# Patient Record
Sex: Female | Born: 1975 | Race: White | Hispanic: No | Marital: Married | Smoking: Never smoker
Health system: Southern US, Community
[De-identification: ages and names within clinical notes are randomized; demographics above are authoritative.]

## PROBLEM LIST (undated history)

## (undated) DIAGNOSIS — C801 Malignant (primary) neoplasm, unspecified: Secondary | ICD-10-CM

## (undated) DIAGNOSIS — C439 Malignant melanoma of skin, unspecified: Secondary | ICD-10-CM

## (undated) HISTORY — PX: ECTOPIC PREGNANCY SURGERY: SHX613

## (undated) HISTORY — PX: BREAST SURGERY: SHX581

## (undated) HISTORY — PX: BREAST ENHANCEMENT SURGERY: SHX7

## (undated) HISTORY — DX: Malignant (primary) neoplasm, unspecified: C80.1

---

## 2001-12-26 ENCOUNTER — Other Ambulatory Visit: Admission: RE | Admit: 2001-12-26 | Discharge: 2001-12-26 | Payer: Self-pay | Admitting: Obstetrics and Gynecology

## 2002-05-10 ENCOUNTER — Ambulatory Visit (HOSPITAL_COMMUNITY): Admission: RE | Admit: 2002-05-10 | Discharge: 2002-05-10 | Payer: Self-pay | Admitting: Obstetrics and Gynecology

## 2002-05-10 ENCOUNTER — Encounter: Payer: Self-pay | Admitting: Obstetrics and Gynecology

## 2002-07-08 ENCOUNTER — Inpatient Hospital Stay (HOSPITAL_COMMUNITY): Admission: AD | Admit: 2002-07-08 | Discharge: 2002-07-08 | Payer: Self-pay | Admitting: Obstetrics and Gynecology

## 2003-02-28 ENCOUNTER — Other Ambulatory Visit: Admission: RE | Admit: 2003-02-28 | Discharge: 2003-02-28 | Payer: Self-pay | Admitting: Obstetrics and Gynecology

## 2003-09-02 ENCOUNTER — Inpatient Hospital Stay (HOSPITAL_COMMUNITY): Admission: AD | Admit: 2003-09-02 | Discharge: 2003-09-02 | Payer: Self-pay | Admitting: Obstetrics and Gynecology

## 2003-09-03 ENCOUNTER — Inpatient Hospital Stay (HOSPITAL_COMMUNITY): Admission: AD | Admit: 2003-09-03 | Discharge: 2003-09-05 | Payer: Self-pay | Admitting: Obstetrics and Gynecology

## 2003-10-29 ENCOUNTER — Other Ambulatory Visit: Admission: RE | Admit: 2003-10-29 | Discharge: 2003-10-29 | Payer: Self-pay | Admitting: Obstetrics and Gynecology

## 2004-01-26 ENCOUNTER — Ambulatory Visit (HOSPITAL_BASED_OUTPATIENT_CLINIC_OR_DEPARTMENT_OTHER): Admission: RE | Admit: 2004-01-26 | Discharge: 2004-01-26 | Payer: Self-pay | Admitting: Plastic Surgery

## 2004-01-26 ENCOUNTER — Encounter (INDEPENDENT_AMBULATORY_CARE_PROVIDER_SITE_OTHER): Payer: Self-pay | Admitting: Plastic Surgery

## 2004-01-26 ENCOUNTER — Ambulatory Visit (HOSPITAL_COMMUNITY): Admission: RE | Admit: 2004-01-26 | Discharge: 2004-01-26 | Payer: Self-pay | Admitting: Plastic Surgery

## 2004-12-24 ENCOUNTER — Other Ambulatory Visit: Admission: RE | Admit: 2004-12-24 | Discharge: 2004-12-24 | Payer: Self-pay | Admitting: Obstetrics and Gynecology

## 2005-06-24 ENCOUNTER — Inpatient Hospital Stay (HOSPITAL_COMMUNITY): Admission: AD | Admit: 2005-06-24 | Discharge: 2005-06-25 | Payer: Self-pay | Admitting: Obstetrics and Gynecology

## 2005-08-15 ENCOUNTER — Other Ambulatory Visit: Admission: RE | Admit: 2005-08-15 | Discharge: 2005-08-15 | Payer: Self-pay | Admitting: Obstetrics and Gynecology

## 2006-04-26 ENCOUNTER — Encounter (INDEPENDENT_AMBULATORY_CARE_PROVIDER_SITE_OTHER): Payer: Self-pay | Admitting: *Deleted

## 2006-04-26 ENCOUNTER — Ambulatory Visit (HOSPITAL_BASED_OUTPATIENT_CLINIC_OR_DEPARTMENT_OTHER): Admission: RE | Admit: 2006-04-26 | Discharge: 2006-04-26 | Payer: Self-pay | Admitting: Plastic Surgery

## 2007-08-23 ENCOUNTER — Encounter: Admission: RE | Admit: 2007-08-23 | Discharge: 2007-08-23 | Payer: Self-pay | Admitting: Obstetrics and Gynecology

## 2008-06-04 ENCOUNTER — Encounter: Admission: RE | Admit: 2008-06-04 | Discharge: 2008-06-04 | Payer: Self-pay | Admitting: Obstetrics and Gynecology

## 2010-12-24 NOTE — Op Note (Signed)
NAMEADRIENA, Olivia Dillon              ACCOUNT NO.:  [id number]   MEDICAL RECORD NO.:  [id number]          PATIENT TYPE:  AMB   LOCATION:  DSC                          FACILITY:  [facility]   PHYSICIAN:  Alfredia Ferguson, M.D.  DATE OF BIRTH:  1976/03/01   DATE OF PROCEDURE:  04/26/2006  DATE OF DISCHARGE:                                 OPERATIVE REPORT   PREOPERATIVE DIAGNOSIS:  Raised scar upper abdomen left side from previous  site of malignant melanoma excision.   POSTOPERATIVE DIAGNOSIS:  Raised scar upper abdomen left side from previous  site of malignant melanoma excision.   OPERATION:  Elliptical excision of area of raised scar left abdomen, 1.5 cm.   SURGEON:  Alfredia Ferguson, M.D.   ANESTHESIA:  2% Xylocaine with 1:100,000 epinephrine.   INDICATIONS FOR SURGERY:  35 year old woman who, several years ago, had a  malignant melanoma excised from her left upper abdomen.  The patient has an  approximately 10 cm transversely oriented scar.  In the medial aspect of the  scar, it is slightly raised and the patient would like to have this area  removed and made sure that there is no residual melanoma that may be causing  elevation.  She understands the risks of surgery including unsightly  scarring.  In spite of that, she wishes to proceed.   DESCRIPTION OF SURGERY:  An elliptical skin mark was placed around the  raised area in the medial aspect of the scar.  Local anesthesia was  infiltrated.  The wound was prepped with Betadine and draped with sterile  drapes.  An elliptical excision of the raised scar was carried out and  specimen submitted for pathology.  Hemostasis was accomplished using  pressure.  The wound was closed by approximating the dermis with interrupted  4-0 Monocryl sutures.  The skin edges were united with a running 4-0  Monocryl subcuticular.  The area was cleansed and dried.  Steri-Strips were  applied.  A light dressing was also applied.  The patient was  discharged  home in satisfactory condition.      Alfredia Ferguson, M.D.  Electronically Signed     WBB/MEDQ  D:  04/26/2006  T:  04/27/2006  Job:  [id number]

## 2010-12-24 NOTE — Op Note (Signed)
Olivia Dillon, Olivia Dillon                        ACCOUNT NO.:  [id number]   MEDICAL RECORD NO.:  [id number]                   PATIENT TYPE:  AMB   LOCATION:  DSC                                  FACILITY:  [facility]   PHYSICIAN:  Alfredia Ferguson, M.D.               DATE OF BIRTH:  [birth year]   DATE OF PROCEDURE:  01/26/2004  DATE OF DISCHARGE:                                 OPERATIVE REPORT   PREOPERATIVE DIAGNOSIS:  1. Malignant melanoma in situ, left upper abdomen, 8 mm lesion.  2. 5 mm suspicious pigmented nevus, medial aspect, right second toe in the     webspace.  3. Suspicious lesion, left lateral breast, 5 mm.  4. Suspicious pigmented lesion, left shoulder, 3 mm.  5. Epidermal inclusion cyst, left lateral neck, 7 mm.   POSTOPERATIVE DIAGNOSIS:  1. Malignant melanoma in situ, left upper abdomen, 8 mm lesion.  2. 5 mm suspicious pigmented nevus, medial aspect, right second toe in the     webspace.  3. Suspicious lesion, left lateral breast, 5 mm.  4. Suspicious pigmented lesion, left shoulder, 3 mm.  5. Epidermal inclusion cyst, left lateral neck, 7 mm.   OPERATION PERFORMED:  1. Excision of malignant melanoma with 1.8 cm margins.  2. Excision of suspicious pigmented nevus, right medial second toe.  3. Excision of suspicious lesion, left lateral breast.  4. Excision of suspicious pigmented lesion, left shoulder.  5. Excision of epidermal inclusion cyst, left lateral neck.   SURGEON:  Alfredia Ferguson, M.D.   ANESTHESIA:  MAC supplemented with 2% Xylocaine, 1:100,000 epinephrine for  all areas with the exception of the right toe which was anesthetized with 1%  Xylocaine plain.   INDICATIONS FOR PROCEDURE:  The patient is a 35 year old woman with a recent  biopsy proven malignant melanoma of an 8 mm lesion in the left upper  abdomen.  The lesion returned malignant melanoma in situ.  The plan is to  return to the operating room at this time for wide excision of the lesion.  The patient understands the risks that she may require further surgery if  there are positive margins albeit unlikely with this type of lesion.  The  patient also has multiple other suspicious pigmented nevi which appear to  potentially be dysplastic.  For that reason, I have recommended removal of  those lesions. The patient also has an epidermal inclusion cyst in the left  lateral neck which is enlarging and she would like it removed.   DESCRIPTION OF PROCEDURE:  Skin markers were placed around all of the  lesions described.  The distance around the malignant melanoma was measured  at approximately 1.8 to 1.9 cm.  Local anesthesia was infiltrated in all  areas.  The toe was anesthetized with 1% Xylocaine plain.  Attention was  directed first to the right foot.  The foot was prepped with Betadine and  draped with  sterile drapes.  An elliptical excision around the lesion on the  medial aspect of the right second toe was carried out.  The excision depth  was down to the subcutaneous tissue.  Specimen was passed off for pathology.  Hemostasis was accomplished using pressure.  Wound edges were undermined for  a distance of several millimeters in all directions.  The skin incision was  closed using multiple interrupted 5-0 nylon suture.  Attention was directed  to the lesion on the left shoulder.  Using a 3 mm punch biopsy, instrument,  the lesion was punched out.  There were minimal margins but felt that the  margins removed for this type lesion appeared to be adequate.  The incision  was  closed with a single 5-0 nylon suture.  The lesion in the left lateral  breast had been previously marked with an elliptical skin incision with 2 mm  margins.  The area was prepped with Betadine and draped with sterile drapes.  Elliptical excision of the lesion down to the level of the subcutaneous  tissue was carried out.  Specimen was passed off for pathology.  Wound edges  were undermined for a distance  of several millimeters in all directions.  The wound was closed by approximating the dermis using multiple interrupted  5-0 Monocryl sutures.  Skin edges were steri-stripped.  The cyst in the left  lateral neck was excised after prepping and draping.  It was excised down to  the level of the subcutaneous tissue.  Tissue was passed off for pathology.  Wound edges were undermined in a similar fashion as other incisions.  The  wound was closed by approximating the dermis using multiple interrupted 5-0  Monocryl sutures.  Skin edges were united using Steri-Strips.  Attention was  then directed to the melanoma.  Wide elliptical incision transversely  oriented was carried out.  The depth of the incision was down to the  anterior rectus fascia.  The specimen was passed off after excision.  The  wound edges were undermined for a distance of 1 cm in all directions.  The  wound was closed by approximating the dermis using multiple interrupted 4-0  Monocryl sutures.  The skin edges were united using a running 4-0 Monocryl  subcuticular.  Skin edges were then steri-stripped.  All areas were  cleansed, dried and light dressings were applied.  The patient was awakened  and transported to recovery room in satisfactory condition.                                               Alfredia Ferguson, M.D.    Jeani Hawking  D:  01/26/2004  T:  01/26/2004  Job:  [id number]

## 2012-06-01 ENCOUNTER — Other Ambulatory Visit: Payer: Self-pay | Admitting: Obstetrics and Gynecology

## 2013-06-03 ENCOUNTER — Other Ambulatory Visit: Payer: Self-pay | Admitting: Obstetrics and Gynecology

## 2013-06-04 ENCOUNTER — Other Ambulatory Visit: Payer: Self-pay | Admitting: Obstetrics and Gynecology

## 2013-06-04 DIAGNOSIS — N6321 Unspecified lump in the left breast, upper outer quadrant: Secondary | ICD-10-CM

## 2013-06-20 ENCOUNTER — Ambulatory Visit
Admission: RE | Admit: 2013-06-20 | Discharge: 2013-06-20 | Disposition: A | Discharge status: Home / Self Care | Payer: 59 | Source: Ambulatory Visit | Attending: Obstetrics and Gynecology | Admitting: Obstetrics and Gynecology

## 2013-06-20 DIAGNOSIS — N6321 Unspecified lump in the left breast, upper outer quadrant: Secondary | ICD-10-CM

## 2013-08-12 ENCOUNTER — Ambulatory Visit (INDEPENDENT_AMBULATORY_CARE_PROVIDER_SITE_OTHER): Payer: Self-pay | Admitting: General Surgery

## 2013-08-30 ENCOUNTER — Ambulatory Visit (INDEPENDENT_AMBULATORY_CARE_PROVIDER_SITE_OTHER): Payer: 59 | Admitting: General Surgery

## 2013-09-04 ENCOUNTER — Encounter (INDEPENDENT_AMBULATORY_CARE_PROVIDER_SITE_OTHER): Payer: Self-pay | Admitting: General Surgery

## 2013-09-23 ENCOUNTER — Ambulatory Visit (INDEPENDENT_AMBULATORY_CARE_PROVIDER_SITE_OTHER): Payer: 59 | Admitting: Surgery

## 2013-09-27 ENCOUNTER — Encounter (INDEPENDENT_AMBULATORY_CARE_PROVIDER_SITE_OTHER): Payer: Self-pay | Admitting: Surgery

## 2013-10-01 ENCOUNTER — Ambulatory Visit (INDEPENDENT_AMBULATORY_CARE_PROVIDER_SITE_OTHER): Payer: 59 | Admitting: Surgery

## 2013-10-01 ENCOUNTER — Ambulatory Visit (INDEPENDENT_AMBULATORY_CARE_PROVIDER_SITE_OTHER): Payer: 59 | Admitting: General Surgery

## 2013-10-25 ENCOUNTER — Encounter (INDEPENDENT_AMBULATORY_CARE_PROVIDER_SITE_OTHER): Payer: Self-pay | Admitting: Surgery

## 2013-10-25 ENCOUNTER — Encounter (INDEPENDENT_AMBULATORY_CARE_PROVIDER_SITE_OTHER): Payer: Self-pay

## 2013-10-25 ENCOUNTER — Ambulatory Visit (INDEPENDENT_AMBULATORY_CARE_PROVIDER_SITE_OTHER): Payer: 59 | Admitting: Surgery

## 2013-10-25 VITALS — BP 124/80 | HR 80 | Temp 97.6°F | Ht 65.0 in | Wt 147.0 lb

## 2013-10-25 DIAGNOSIS — N6019 Diffuse cystic mastopathy of unspecified breast: Secondary | ICD-10-CM

## 2013-10-25 NOTE — Progress Notes (Signed)
Subjective:     Patient ID: Olivia Dillon, female   DOB: 02-Aug-1976, 38 y.o.   MRN: [id number]  HPI This is a pleasant patient referred to me by Dr. Helane Rima for evaluation of a possible left breast mass. The patient reports that Dr. Runell Gess palpated the mass several years ago. At that time it was investigated in all films were negative. She can only intermittently feel the mass. She has had no previous problems regarding her breast. She denies nipple discharge. She is otherwise without complaints.  Review of Systems     Objective:   Physical Exam On physical examination, there is no left axillary adenopathy. There is a very soft nodule that is consistent with lobular breast tissue in the upper outer quadrant of the left breast.  Her mammogram and ultrasound showed no abnormalities or masses in the area of palpable concern    Assessment:     Left breast fibrocystic changes     Plan:     Given the multiple negative films and a fairly benign physical examination, I do not believe any further workup is necessary. She will continue her self-examinations. If anything should change, she will come back and see me.

## 2013-10-31 ENCOUNTER — Encounter (INDEPENDENT_AMBULATORY_CARE_PROVIDER_SITE_OTHER): Payer: Self-pay

## 2014-02-28 ENCOUNTER — Other Ambulatory Visit: Payer: Self-pay

## 2014-02-28 DIAGNOSIS — Z1231 Encounter for screening mammogram for malignant neoplasm of breast: Secondary | ICD-10-CM

## 2014-03-11 ENCOUNTER — Ambulatory Visit
Admission: RE | Admit: 2014-03-11 | Discharge: 2014-03-11 | Disposition: A | Discharge status: Home / Self Care | Payer: 59 | Source: Ambulatory Visit

## 2014-03-11 ENCOUNTER — Other Ambulatory Visit: Payer: Self-pay

## 2014-03-11 DIAGNOSIS — Z1231 Encounter for screening mammogram for malignant neoplasm of breast: Secondary | ICD-10-CM

## 2014-06-23 ENCOUNTER — Ambulatory Visit: Payer: 59

## 2014-06-30 ENCOUNTER — Other Ambulatory Visit: Payer: Self-pay | Admitting: Obstetrics and Gynecology

## 2014-07-04 LAB — CYTOLOGY - PAP

## 2015-07-29 IMAGING — MG MM DIAGNOSTIC BILATERAL
8 of 9 series · 8 of 9 positions shown · non-contrast
Comparison: 08/23/2007

CLINICAL DATA: Left breast lump.

EXAM:
DIGITAL DIAGNOSTIC  BILATERAL MAMMOGRAM WITH CAD
ULTRASOUND LEFT BREAST

[R CC]
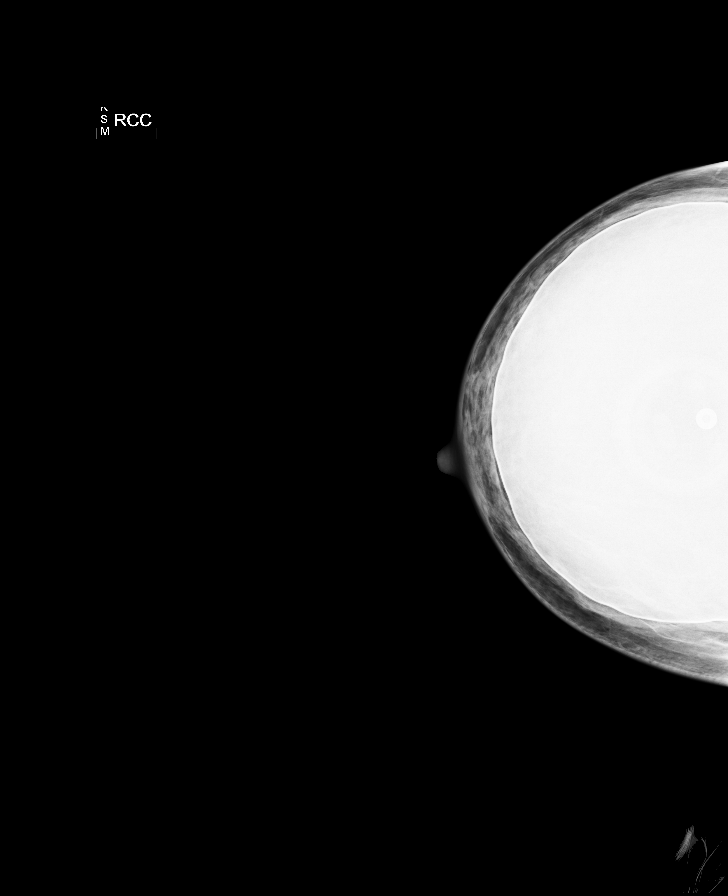

[L CC]
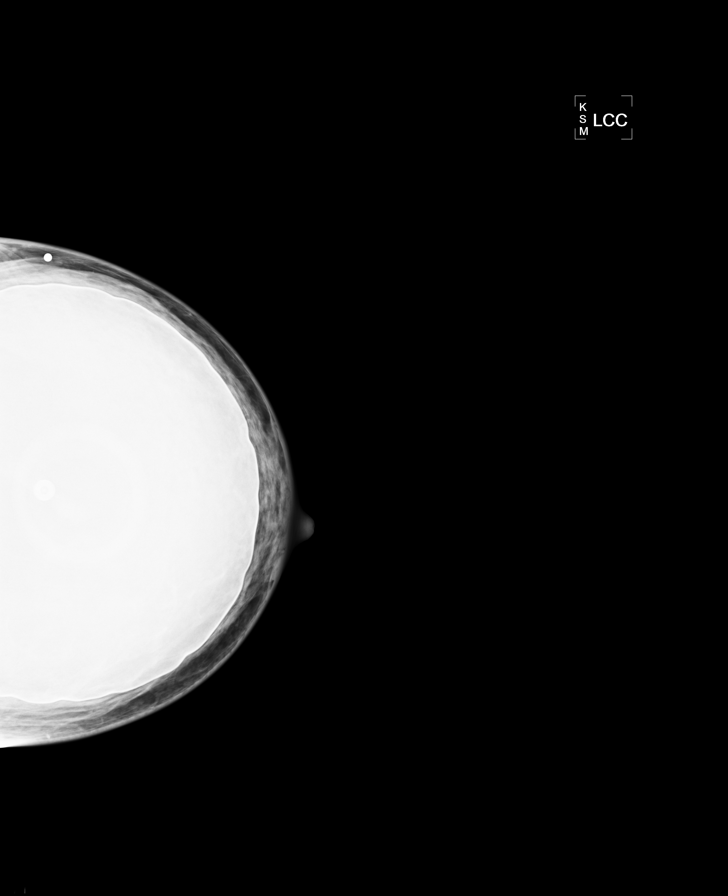

[L MLO]
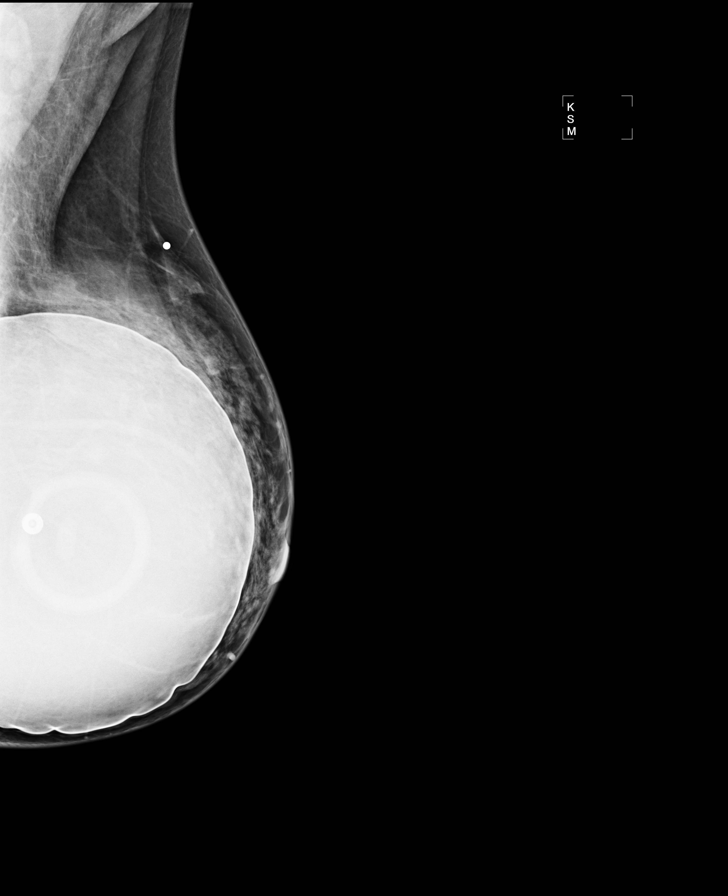

[R MLO]
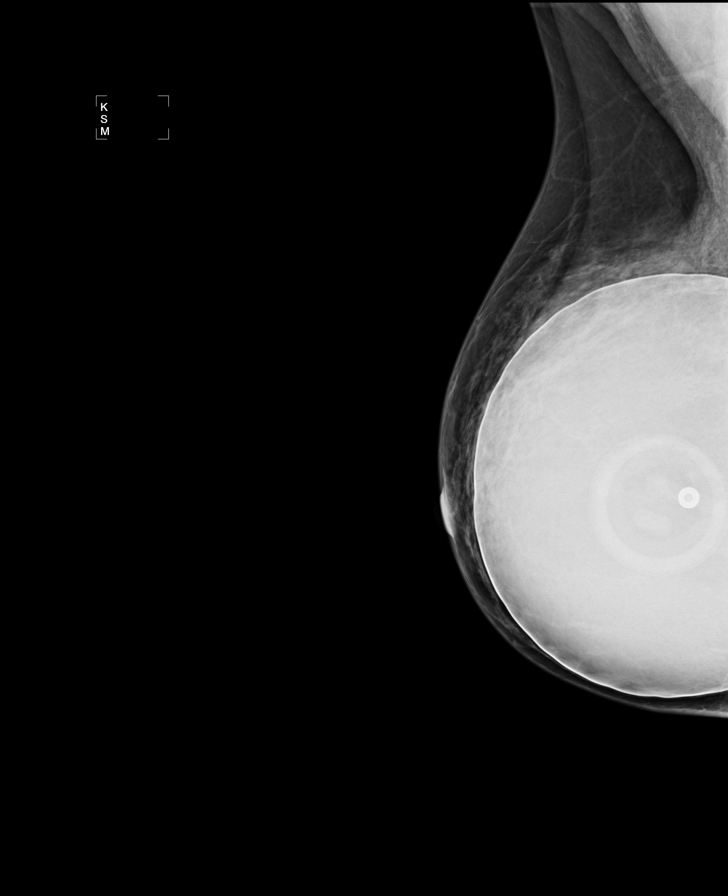

[L CCID]
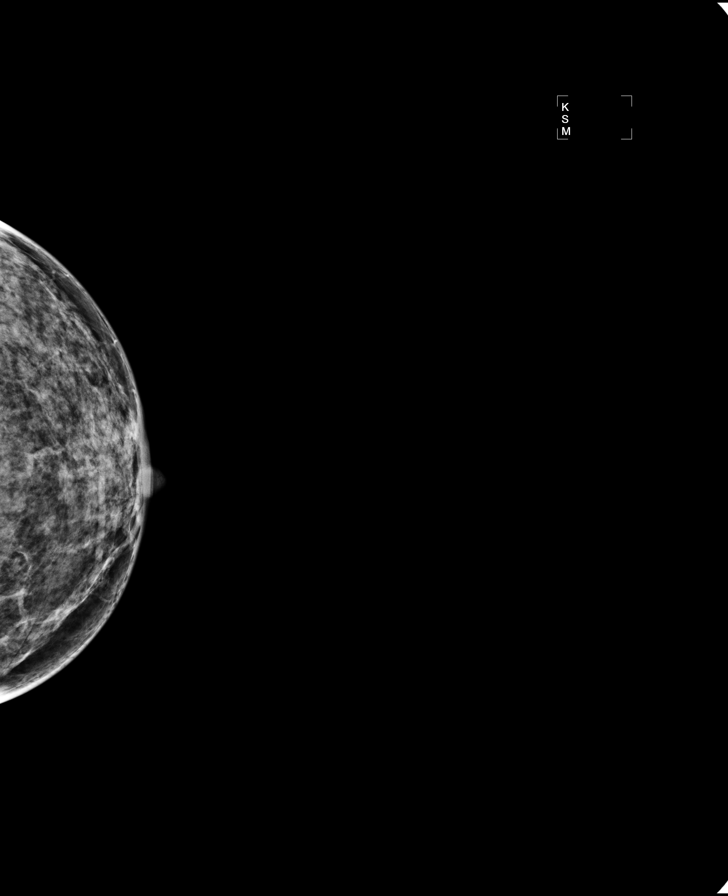

[L MLOID]
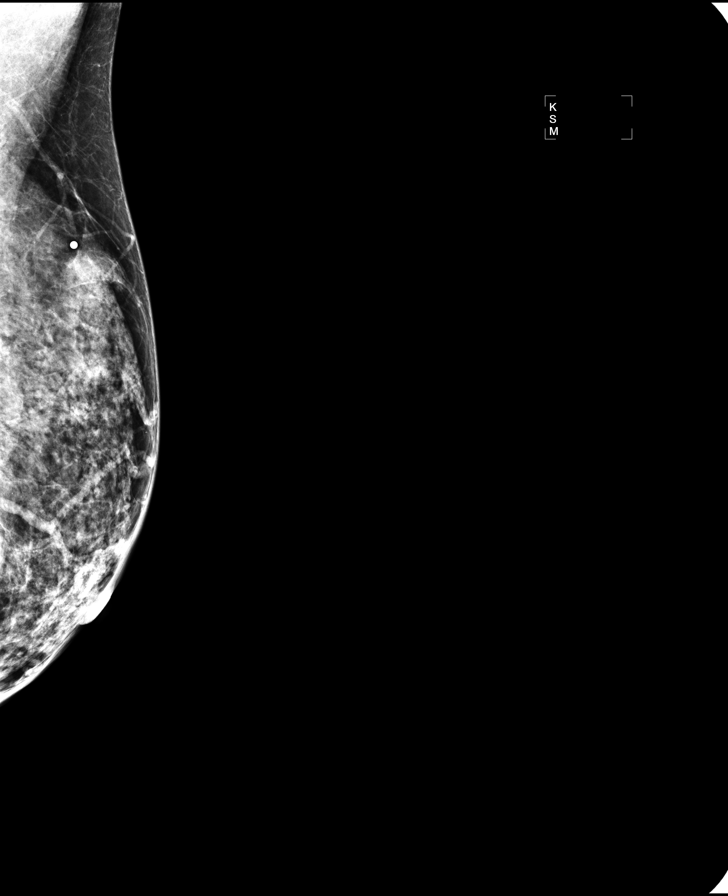

[R CCID]
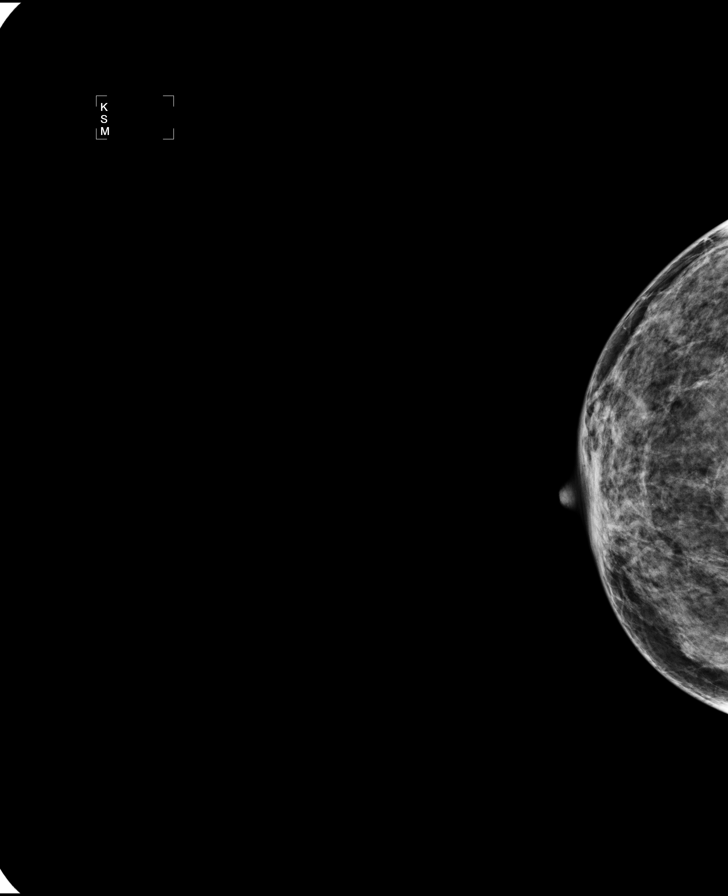

[R MLOID]
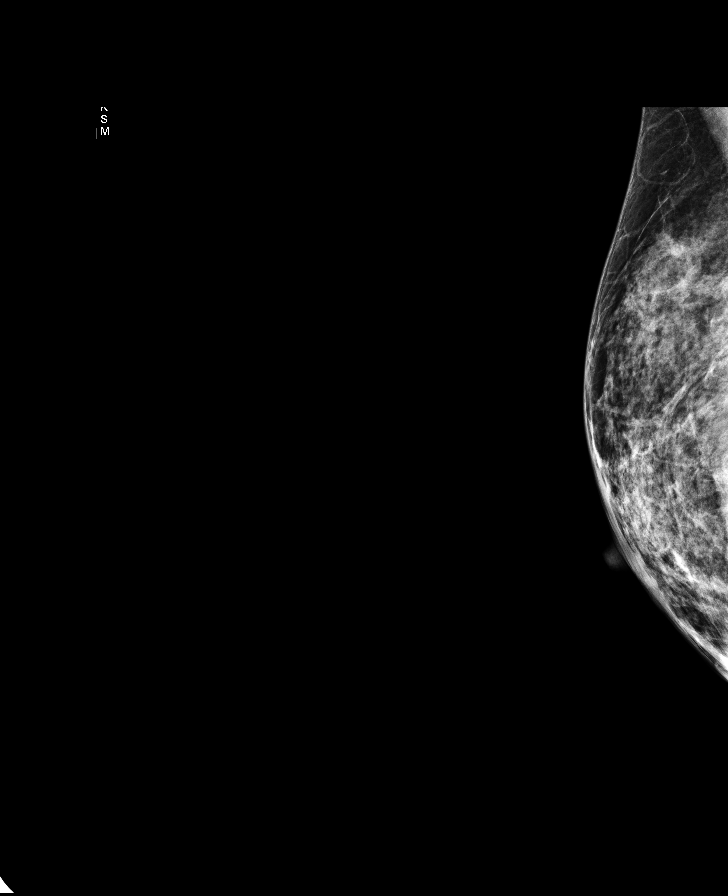

[8 of 9 positions shown; findings below may reference images not displayed]

ACR Breast Density Category c: The breast tissue is heterogeneously
dense, which may obscure small masses.
FINDINGS: Retropectoral saline implants are noted bilaterally. No suspicious
microcalcifications, masses, or architectural distortion.

Mammographic images were processed with CAD.

On physical exam,no mass is identified in the upper-outer left
breast in the area of palpable concern.

Targeted ultrasound is negative.
IMPRESSION: Benign findings.

RECOMMENDATION:
Screening mammogram at age 38 unless there are persistent or
intervening clinical concerns. (Code:LJ-U-2X6)

I have discussed the findings and recommendations with the patient.
Results were also provided in writing at the conclusion of the
visit.

BI-RADS CATEGORY  2: Benign Finding(s)

## 2019-03-01 ENCOUNTER — Other Ambulatory Visit: Payer: Self-pay

## 2019-03-01 ENCOUNTER — Ambulatory Visit (INDEPENDENT_AMBULATORY_CARE_PROVIDER_SITE_OTHER): Payer: Managed Care, Other (non HMO) | Admitting: Physician Assistant

## 2019-03-01 ENCOUNTER — Encounter: Payer: Self-pay | Admitting: Physician Assistant

## 2019-03-01 VITALS — BP 115/73 | HR 78 | Wt 162.0 lb

## 2019-03-01 DIAGNOSIS — G43009 Migraine without aura, not intractable, without status migrainosus: Secondary | ICD-10-CM | POA: Insufficient documentation

## 2019-03-01 MED ORDER — NURTEC 75 MG PO TBDP: 75.0000 mg | ORAL_TABLET | ORAL | 11 refills | Status: AC | PRN

## 2019-03-01 MED ORDER — AJOVY 225 MG/1.5ML ~~LOC~~ SOAJ: 225.0000 mg | SUBCUTANEOUS | 11 refills | Status: AC

## 2019-03-01 MED ORDER — RIZATRIPTAN BENZOATE 10 MG PO TABS: 10.0000 mg | ORAL_TABLET | ORAL | 11 refills | Status: AC | PRN

## 2019-03-01 MED ORDER — CYCLOBENZAPRINE HCL 10 MG PO TABS: 10.0000 mg | ORAL_TABLET | Freq: Three times a day (TID) | ORAL | 1 refills | Status: AC | PRN

## 2019-03-01 NOTE — Patient Instructions (Signed)

## 2019-03-01 NOTE — Progress Notes (Signed)
History:  Olivia Dillon is a 43 y.o. Headaches began 20 years ago or more.  She has used Imitrex and Treximet.  They make the pain worse before it gets better.  It initially makes it feel like her head is going to explode.  She often puts off using the medication.  They last 2-3 days, unilateral around her left eye and sometimes moves back, staying on the left side.. She has sensitivity on the left side of her head.  No throbbing, positional change makes it worse,  Denies N/V/Photo/Phonophobia.   Prior to [place] feels like there is a tug on her eye.   Previously she thinks she tried a beta blocker, maybe metoprolol and another medicine, maybe imipramine for prevention without good results. Sleep and rest help the headache go away.  Treximet works best if she can take it and immediately go to sleep.  Sleep has been difficult for years - falling asleep and staying asleep.      HIT6:54 Number of days in the last 4 weeks with:  Severe headache: 3 Moderate headache: 10 Mild headache: 10 No headache: 5   Past Medical History:  Diagnosis Date  . Cancer [facility])     Social History   Socioeconomic History  . Marital status: Married    Spouse name: Not on file  . Number of children: Not on file  . Years of education: Not on file  . Highest education level: Not on file  Occupational History  . Not on file  Social Needs  . Financial resource strain: Not on file  . Food insecurity    Worry: Not on file    Inability: Not on file  . Transportation needs    Medical: Not on file    Non-medical: Not on file  Tobacco Use  . Smoking status: Never Smoker  . Smokeless tobacco: Never Used  Substance and Sexual Activity  . Alcohol use: Yes  . Drug use: No  . Sexual activity: Yes    Birth control/protection: Pill  Lifestyle  . Physical activity    Days per week: Not on file    Minutes per session: Not on file  . Stress: Not on file  Relationships  . Social Herbalist on phone:  Not on file    Gets together: Not on file    Attends religious service: Not on file    Active member of club or organization: Not on file    Attends meetings of clubs or organizations: Not on file    Relationship status: Not on file  . Intimate partner violence    Fear of current or ex partner: Not on file    Emotionally abused: Not on file    Physically abused: Not on file    Forced sexual activity: Not on file  Other Topics Concern  . Not on file  Social History Narrative  . Not on file    Family History  Problem Relation Age of Onset  . Cancer Father     Allergies  Allergen Reactions  . Codeine   . Erythromycin   . Morphine And Related     Current Outpatient Medications on File Prior to Visit  Medication Sig Dispense Refill  . Norethin Ace-Eth Estrad-FE (MINASTRIN 24 FE PO) Take by mouth.    . SUMAtriptan-naproxen (TREXIMET) 85-500 MG per tablet Take 1 tablet by mouth every 2 (two) hours as needed for migraine.    . ValACYclovir HCl (VALTREX PO)  Take by mouth.    . BuPROPion HCl (WELLBUTRIN XL PO) Take by mouth.     No current facility-administered medications on file prior to visit.      Review of Systems:  All pertinent positive/negative included in HPI, all other review of systems are negative   Objective:  Physical Exam BP 115/73   Pulse 78   Wt 162 lb (73.5 kg)   BMI 26.96 kg/m  CONSTITUTIONAL: Well-developed, well-nourished female in no acute distress.  EYES: EOM intact ENT: Normocephalic CARDIOVASCULAR: Regular rate   RESPIRATORY: Normal rate MUSCULOSKELETAL: Normal ROM SKIN: Warm, dry without erythema  NEUROLOGICAL: Alert, oriented, CN II-XII grossly intact, Appropriate balance  PSYCH: Normal behavior, mood   Assessment & Plan:  Assessment: 1. Migraine without aura and without status migrainosus, not intractable   new diagnosis   Plan: Will trial Nurtec for acute migraine Will use Flexeril for subacute migraine/muscle spasm Will trial  ajovy for prevention Will trial Rizatriptan for acute migraine. Extensive education about how and when to use meds provided.  Samples and savings cards provided.    Follow-up in 3 months or sooner PRN  Paticia Stack, PA-C 03/01/2019 10:22 AM

## 2019-03-05 ENCOUNTER — Telehealth: Payer: Self-pay | Admitting: *Deleted

## 2019-03-05 NOTE — Telephone Encounter (Signed)
Prior auth approval received for both Nurtec and Ajovy from 02/02/2019-03/03/2020 from cover my meds.

## 2019-03-19 LAB — HM PAP SMEAR: HPV, high-risk: NEGATIVE

## 2021-10-13 LAB — HM PAP SMEAR: HPV, high-risk: NEGATIVE

## 2022-01-14 ENCOUNTER — Other Ambulatory Visit: Payer: Self-pay

## 2022-01-14 ENCOUNTER — Emergency Department (HOSPITAL_BASED_OUTPATIENT_CLINIC_OR_DEPARTMENT_OTHER)
Admission: EM | Admit: 2022-01-14 | Discharge: 2022-01-14 | Disposition: A | Discharge status: Home / Self Care | Payer: Managed Care, Other (non HMO) | Attending: Emergency Medicine | Admitting: Emergency Medicine

## 2022-01-14 ENCOUNTER — Emergency Department (HOSPITAL_BASED_OUTPATIENT_CLINIC_OR_DEPARTMENT_OTHER): Payer: Managed Care, Other (non HMO) | Admitting: Radiology

## 2022-01-14 ENCOUNTER — Encounter (HOSPITAL_BASED_OUTPATIENT_CLINIC_OR_DEPARTMENT_OTHER): Payer: Self-pay | Admitting: Emergency Medicine

## 2022-01-14 DIAGNOSIS — R0789 Other chest pain: Secondary | ICD-10-CM | POA: Insufficient documentation

## 2022-01-14 DIAGNOSIS — R059 Cough, unspecified: Secondary | ICD-10-CM | POA: Insufficient documentation

## 2022-01-14 HISTORY — DX: Malignant melanoma of skin, unspecified: C43.9

## 2022-01-14 LAB — CBC
HCT: 36.6 % (ref 36.0–46.0)
Hemoglobin: 12.6 g/dL (ref 12.0–15.0)
MCH: 31.5 pg (ref 26.0–34.0)
MCHC: 34.4 g/dL (ref 30.0–36.0)
MCV: 91.5 fL (ref 80.0–100.0)
Platelets: 364 10*3/uL (ref 150–400)
RBC: 4 MIL/uL (ref 3.87–5.11)
RDW: 11.8 % (ref 11.5–15.5)
WBC: 11.1 10*3/uL — ABNORMAL HIGH (ref 4.0–10.5)
nRBC: 0 % (ref 0.0–0.2)

## 2022-01-14 LAB — BASIC METABOLIC PANEL
Anion gap: 12 (ref 5–15)
BUN: 15 mg/dL (ref 6–20)
CO2: 23 mmol/L (ref 22–32)
Calcium: 9.7 mg/dL (ref 8.9–10.3)
Chloride: 104 mmol/L (ref 98–111)
Creatinine, Ser: 1.02 mg/dL — ABNORMAL HIGH (ref 0.44–1.00)
GFR, Estimated: >60 mL/min (ref >60)
Glucose, Bld: 98 mg/dL (ref 70–99)
Potassium: 3.7 mmol/L (ref 3.5–5.1)
Sodium: 139 mmol/L (ref 135–145)

## 2022-01-14 LAB — TROPONIN I (HIGH SENSITIVITY)
Troponin I (High Sensitivity): 2 ng/L (ref <18)
Troponin I (High Sensitivity): <2 ng/L (ref <18)

## 2022-01-14 LAB — D-DIMER, QUANTITATIVE: D-Dimer, Quant: 0.28 ug/mL-FEU (ref 0.00–0.50)

## 2022-01-14 NOTE — ED Triage Notes (Signed)
Pt arrives to ED with c/o chest pain. Pt reports x2 days ago she ate [place] food then started to experience chest pain. The chest pain has been continuous over the past 2 days. Pt reports that last night she experienced radiation of her pain to her left jaw. The CP is centralized.  She also reports x3 weeks of cough that is currently being treated with Symbicort/Albuterol and recently finished amoxicillin for ear infection. She took prilosec last night w/o relief.

## 2022-01-14 NOTE — Discharge Instructions (Addendum)
Your work-up today was very reassuring.  There is no evidence of heart attack, blood clot or any other abnormalities.  I suspect that this chest tightness is secondary to your lingering cough and possible bronchitis.  You can resume using the Symbicort twice daily to help with your symptoms.  If you continue to have chest pain, you can either return to the emergency department or follow-up with your PCP.

## 2022-01-14 NOTE — ED Provider Notes (Signed)
[facility] Provider Note   CSN: [id number] Arrival date & time: 01/14/22  1511     History  Chief Complaint  Patient presents with   Chest Pain    [facility] is a 46 y.o. female who presents to the emergency department for evaluation of chest pain that started 2 days ago.  Patient describes the pain as tightness in the center of her chest and has been fairly constant since symptoms started.  2 nights ago, she noticed that she woke up in the middle the night and had pain focally in the lower part of her jaw.  She was unsure if this was related to her chest pain or if she just slept wrong, however the jaw pain resolved upon waking the next morning.  She has not experienced it since.  Patient notes that the chest pain is aggravated upon deep inspiration.  She notes that she was recently diagnosed with upper respiratory infection and completed a course of antibiotics about 5 days ago.  She was also taking Symbicort twice daily and was feeling better.  Her chest pain symptoms started approximately 2 days after she completed the course of antibiotics.  She continues to have lingering dry cough.  She denies fever, chills, shortness of breath, abdominal pain, nausea, vomiting, diarrhea, leg swelling.  She is on OCP x30 years.  She is a non-smoker and drinks 1 glass of wine a day.   Chest Pain      Home Medications Prior to Admission medications   Medication Sig Start Date End Date Taking? Authorizing Provider  BuPROPion HCl (WELLBUTRIN XL PO) Take by mouth.    [provider]  cyclobenzaprine (FLEXERIL) 10 MG tablet Take 1 tablet (10 mg total) by mouth every 8 (eight) hours as needed for muscle spasms (headache). May take 1/2 tab 03/01/19   Jaclyn Prime, Collene Leyden, PA-C  Fremanezumab-vfrm (AJOVY) 225 MG/1.5ML SOAJ Inject 225 mg into the skin every 30 (thirty) days. 03/01/19   Jaclyn Prime, Collene Leyden, PA-C  Norethin Ace-Eth Estrad-FE (MINASTRIN 24 FE PO) Take  by mouth.    [provider]  Rimegepant Sulfate (NURTEC) 75 MG TBDP Take 75 mg by mouth as needed. 03/01/19   Jaclyn Prime, Collene Leyden, PA-C  rizatriptan (MAXALT) 10 MG tablet Take 1 tablet (10 mg total) by mouth as needed for migraine. May repeat in 2 hours if needed 03/01/19   Jaclyn Prime, Collene Leyden, PA-C  SUMAtriptan-naproxen (TREXIMET) 85-500 MG per tablet Take 1 tablet by mouth every 2 (two) hours as needed for migraine.    [provider]  ValACYclovir HCl (VALTREX PO) Take by mouth.    [provider]      Allergies    Codeine, Doxycycline, Erythromycin, and Morphine and related    Review of Systems   Review of Systems  Cardiovascular:  Positive for chest pain.    Physical Exam Updated Vital Signs BP 111/65   Pulse 64   Temp 98 F (36.7 C)   Resp (!) 21   Ht '5\' 6"'$  (1.676 m)   Wt 71.7 kg   SpO2 99%   BMI 25.50 kg/m  Physical Exam Vitals and nursing note reviewed.  Constitutional:      General: She is not in acute distress.    Appearance: She is not ill-appearing.  HENT:     Head: Atraumatic.  Eyes:     Conjunctiva/sclera: Conjunctivae normal.  Neck:     Vascular: No JVD.  Cardiovascular:  Rate and Rhythm: Normal rate and regular rhythm.     Pulses: Normal pulses.          Carotid pulses are 2+ on the right side and 2+ on the left side.      Dorsalis pedis pulses are 2+ on the right side and 2+ on the left side.     Heart sounds: No murmur heard. Pulmonary:     Effort: Pulmonary effort is normal. No respiratory distress.     Breath sounds: Normal breath sounds.     Comments: Occasional dry cough noted.  Patient speaking in full complete sentences.  Lungs CTA bilaterally Abdominal:     General: Abdomen is flat. There is no distension.     Palpations: Abdomen is soft.     Tenderness: There is no abdominal tenderness.  Musculoskeletal:        General: Normal range of motion.     Cervical back: Normal range of motion.     Right lower  leg: No edema.     Left lower leg: No edema.     Comments: No posterior calf muscle tenderness  Skin:    General: Skin is warm and dry.     Capillary Refill: Capillary refill takes less than 2 seconds.  Neurological:     General: No focal deficit present.     Mental Status: She is alert.  Psychiatric:        Mood and Affect: Mood normal.     ED Results / Procedures / Treatments   Labs (all labs ordered are listed, but only abnormal results are displayed) Labs Reviewed  BASIC METABOLIC PANEL - Abnormal; Notable for the following components:      Result Value   Creatinine, Ser 1.02 (*)    All other components within normal limits  CBC - Abnormal; Notable for the following components:   WBC 11.1 (*)    All other components within normal limits  D-DIMER, QUANTITATIVE  TROPONIN I (HIGH SENSITIVITY)  TROPONIN I (HIGH SENSITIVITY)    EKG EKG Interpretation  Date/Time:  Friday January 14 2022 15:20:04 EDT Ventricular Rate:  81 PR Interval:  142 QRS Duration: 82 QT Interval:  364 QTC Calculation: 422 R Axis:   85 Text Interpretation: Normal sinus rhythm Normal ECG No previous ECGs available Confirmed by Malvin Johns [phone]) on 01/14/2022 7:11:42 PM  Radiology DG Chest 2 View  Result Date: 01/14/2022 CLINICAL DATA:  chest pain EXAM: CHEST - 2 VIEW COMPARISON:  None Available. FINDINGS: The heart and mediastinal contours are within normal limits. Biapical pleural/pulmonary scarring. No focal consolidation. No pulmonary edema. No pleural effusion. No pneumothorax. No acute osseous abnormality. IMPRESSION: No active cardiopulmonary disease. Electronically Signed   By: Iven Finn M.D.   On: 01/14/2022 15:39    Procedures Procedures    Medications Ordered in ED Medications - No data to display  ED Course/ Medical Decision Making/ A&P                           Medical Decision Making Amount and/or Complexity of Data Reviewed Labs: ordered. Radiology: ordered.   Social  determinants of health:  Social History   Socioeconomic History   Marital status: Married    Spouse name: Not on file   Number of children: Not on file   Years of education: Not on file   Highest education level: Not on file  Occupational History   Not on file  Tobacco  Use   Smoking status: Never   Smokeless tobacco: Never  Vaping Use   Vaping Use: Never used  Substance and Sexual Activity   Alcohol use: Yes   Drug use: No   Sexual activity: Yes    Birth control/protection: Pill  Other Topics Concern   Not on file  Social History Narrative   Not on file   Social Determinants of Health   Financial Resource Strain: Not on file  Food Insecurity: Not on file  Transportation Needs: Not on file  Physical Activity: Not on file  Stress: Not on file  Social Connections: Not on file  Intimate Partner Violence: Not on file     Initial impression:  This patient presents to the ED for concern of chest pain x2 days, this involves an extensive number of treatment options, and is a complaint that carries with it a high risk of complications and morbidity.   Differentials include ACS, PE, pneumonia, bronchitis, anxiety.   Comorbidities affecting care:  None  Additional history obtained: None  Lab Tests  I Ordered, reviewed, and interpreted labs and EKG.  The pertinent results include:  Leukocytosis 11.1 Delta troponin normal D-dimer normal  Imaging Studies ordered:  I ordered imaging studies including  Chest x-ray without acute findings I independently visualized and interpreted imaging and I agree with the radiologist interpretation.   EKG: Sinus rhythm  Cardiac Monitoring:  The patient was maintained on a cardiac monitor.  I personally viewed and interpreted the cardiac monitored which showed an underlying rhythm of: Sinus rhythm  ED Course/Re-evaluation: 46 year old female presents to the ED for evaluation of chest pain x2 days.  She is overall well-appearing  and nontoxic.  She is not diaphoretic.  Her vitals are without acute abnormality.  On exam, her lungs are CTA bilaterally.  Normal heart sounds.  No posterior calf tenderness bilaterally.  No leg edema.  Intact distal pulses of the bilateral upper and lower extremities.  Labs show mild leukocytosis 11.1, likely lingering from her recent upper respiratory infection.  Troponin normal.  D-dimer negative.  Chest x-ray without acute findings.  Overall, patient's work-up is very reassuring.  She has a heart score of 1.  Suspicion for cardiac etiology is low.  Patient is likely having chest tightness from bronchitis secondary to her lingering upper respiratory infection.  She has Symbicort at home which she has stopped taking several days ago.  Advised to resume taking Symbicort twice daily.  If symptoms do not improve, she can follow-up with her PCP.  If they worsen, she can return to the emergency department.  Patient expresses understanding and amenable to plan.  Disposition:  After consideration of the diagnostic results, physical exam, history and the patients response to treatment feel that the patent would benefit from discharge.   Atypical chest pain: Plan and management as described above. Discharged home in good condition.  Final Clinical Impression(s) / ED Diagnoses Final diagnoses:  Atypical chest pain    Rx / DC Orders ED Discharge Orders     None         Tonye Pearson, PA-C 01/14/22 2014    Malvin Johns, MD 01/14/22 2337

## 2022-01-14 NOTE — ED Notes (Signed)
Discharge instructions were discussed with pt. Pt verbalized understanding with no questions at this time.  

## 2023-01-31 ENCOUNTER — Other Ambulatory Visit: Payer: Self-pay | Admitting: Obstetrics and Gynecology

## 2023-01-31 DIAGNOSIS — R928 Other abnormal and inconclusive findings on diagnostic imaging of breast: Secondary | ICD-10-CM

## 2023-02-10 ENCOUNTER — Ambulatory Visit
Admission: RE | Admit: 2023-02-10 | Discharge: 2023-02-10 | Disposition: A | Discharge status: Home / Self Care | Payer: Managed Care, Other (non HMO) | Source: Ambulatory Visit | Attending: Obstetrics and Gynecology | Admitting: Obstetrics and Gynecology

## 2023-02-10 DIAGNOSIS — R928 Other abnormal and inconclusive findings on diagnostic imaging of breast: Secondary | ICD-10-CM

## 2023-02-13 ENCOUNTER — Other Ambulatory Visit: Payer: Self-pay | Admitting: Obstetrics and Gynecology

## 2023-02-13 DIAGNOSIS — N6320 Unspecified lump in the left breast, unspecified quadrant: Secondary | ICD-10-CM

## 2023-05-18 ENCOUNTER — Other Ambulatory Visit: Payer: Self-pay | Admitting: Obstetrics and Gynecology

## 2023-05-18 DIAGNOSIS — N644 Mastodynia: Secondary | ICD-10-CM

## 2023-05-23 ENCOUNTER — Ambulatory Visit
Admission: RE | Admit: 2023-05-23 | Discharge: 2023-05-23 | Disposition: A | Discharge status: Home / Self Care | Payer: Managed Care, Other (non HMO) | Source: Ambulatory Visit | Attending: Obstetrics and Gynecology | Admitting: Obstetrics and Gynecology

## 2023-05-23 ENCOUNTER — Ambulatory Visit: Payer: Managed Care, Other (non HMO)

## 2023-05-23 DIAGNOSIS — N644 Mastodynia: Secondary | ICD-10-CM

## 2023-08-24 ENCOUNTER — Ambulatory Visit
Admission: RE | Admit: 2023-08-24 | Discharge: 2023-08-24 | Disposition: A | Discharge status: Home / Self Care | Payer: Managed Care, Other (non HMO) | Source: Ambulatory Visit | Attending: Obstetrics and Gynecology | Admitting: Obstetrics and Gynecology

## 2023-08-24 ENCOUNTER — Other Ambulatory Visit: Payer: Self-pay | Admitting: Obstetrics and Gynecology

## 2023-08-24 DIAGNOSIS — N6320 Unspecified lump in the left breast, unspecified quadrant: Secondary | ICD-10-CM

## 2024-02-02 LAB — LAB REPORT - SCANNED
A1c: 5.3
Free T4: 0.97
TSH: 1.05 (ref 0.41–5.90)

## 2024-03-13 ENCOUNTER — Encounter: Payer: Self-pay | Admitting: Radiology

## 2024-03-13 ENCOUNTER — Ambulatory Visit: Payer: Self-pay | Admitting: Radiology

## 2024-03-13 VITALS — BP 112/78 | HR 80 | Ht 65.0 in | Wt 167.0 lb

## 2024-03-13 DIAGNOSIS — N951 Menopausal and female climacteric states: Secondary | ICD-10-CM

## 2024-03-13 MED ORDER — ESTRADIOL 0.0375 MG/24HR TD PTTW: 1.0000 | MEDICATED_PATCH | TRANSDERMAL | 4 refills | Status: DC

## 2024-03-13 MED ORDER — PROGESTERONE MICRONIZED 100 MG PO CAPS: 100.0000 mg | ORAL_CAPSULE | Freq: Every evening | ORAL | 3 refills | Status: DC

## 2024-03-13 NOTE — Progress Notes (Signed)
   [facility] [birth year] [id number]   History:  48 y.o. G4P2 presents for HRT consult. Transfer from [facility]. Currently on OCPs, ready to transition to HRT. Joint pain, trouble falling back asleep if woken up, weight gain, elevated cholesterol. No hot flashes or night sweats.  Gynecologic History No LMP recorded. (Menstrual status: Oral contraceptives).   Contraception/Family planning: OCP (estrogen/progesterone ) Sexually active: yes Last Pap: 2023. Results were: normal Last mammogram: 10/24. Results were: normal  Obstetric History OB History  Gravida Para Term Preterm AB Living  4 2   2 2   SAB IAB Ectopic Multiple Live Births    2      # Outcome Date GA Lbr Len/2nd Weight Sex Type Anes PTL Lv  4 Ectopic           3 Ectopic           2 Para           1 Para                The following portions of the patient's history were reviewed and updated as appropriate: allergies, current medications, past family history, past medical history, past social history, past surgical history, and problem list.  Review of Systems  All other systems reviewed and are negative.   Past medical history, past surgical history, family history and social history were all reviewed and documented in the EPIC chart.  Exam:  Vitals:   03/13/24 1435  BP: 112/78  Pulse: 80  SpO2: 97%  Weight: 167 lb (75.8 kg)  Height: 5' 5 (1.651 m)   Body mass index is 27.79 kg/m.  Physical Exam Constitutional:      Appearance: Normal appearance.  Pulmonary:     Effort: Pulmonary effort is normal.  Neurological:     Mental Status: She is alert.  Psychiatric:        Mood and Affect: Mood normal.        Thought Content: Thought content normal.        Judgment: Judgment normal.      Assessment/Plan:   1. Perimenopausal (Primary) Risks and benefits reviewed Back up method needed for pregnancy prevention - estradiol  (VIVELLE -DOT) 0.0375 MG/24HR; Place 1 patch onto the skin 2 (two) times a week.   Dispense: 24 patch; Refill: 4 - progesterone  (PROMETRIUM ) 100 MG capsule; Take 1 capsule (100 mg total) by mouth at bedtime.  Dispense: 90 capsule; Refill: 3   AEX 01/2025  GINETTE COZIER B WHNP-BC 3:06 PM 03/13/2024

## 2024-04-10 ENCOUNTER — Encounter: Payer: Self-pay | Admitting: Gastroenterology

## 2024-04-10 ENCOUNTER — Other Ambulatory Visit: Payer: Self-pay | Admitting: Radiology

## 2024-04-10 DIAGNOSIS — Z1211 Encounter for screening for malignant neoplasm of colon: Secondary | ICD-10-CM

## 2024-04-25 ENCOUNTER — Encounter

## 2024-05-09 ENCOUNTER — Other Ambulatory Visit

## 2024-05-09 ENCOUNTER — Encounter: Admitting: Gastroenterology

## 2024-05-09 ENCOUNTER — Encounter

## 2024-05-20 ENCOUNTER — Ambulatory Visit

## 2024-05-20 ENCOUNTER — Ambulatory Visit: Payer: Self-pay

## 2024-05-28 ENCOUNTER — Other Ambulatory Visit: Payer: Self-pay | Admitting: Obstetrics and Gynecology

## 2024-05-28 ENCOUNTER — Other Ambulatory Visit: Payer: Self-pay | Admitting: Radiology

## 2024-05-28 ENCOUNTER — Ambulatory Visit
Admission: RE | Admit: 2024-05-28 | Discharge: 2024-05-28 | Disposition: A | Payer: Self-pay | Source: Ambulatory Visit | Attending: Obstetrics and Gynecology | Admitting: Obstetrics and Gynecology

## 2024-05-28 ENCOUNTER — Ambulatory Visit
Admission: RE | Admit: 2024-05-28 | Discharge: 2024-05-28 | Disposition: A | Source: Ambulatory Visit | Attending: Obstetrics and Gynecology | Admitting: Obstetrics and Gynecology

## 2024-05-28 DIAGNOSIS — N951 Menopausal and female climacteric states: Secondary | ICD-10-CM

## 2024-05-28 DIAGNOSIS — N6320 Unspecified lump in the left breast, unspecified quadrant: Secondary | ICD-10-CM

## 2024-05-28 MED ORDER — PROGESTERONE MICRONIZED 100 MG PO CAPS: 200.0000 mg | ORAL_CAPSULE | Freq: Every evening | ORAL | 2 refills | Status: AC

## 2024-05-28 MED ORDER — ESTRADIOL 0.075 MG/24HR TD PTTW: 1.0000 | MEDICATED_PATCH | TRANSDERMAL | 2 refills | Status: AC

## 2024-05-28 NOTE — Progress Notes (Signed)
 Patient called with reports of trouble sleeping, irregular bleeding. Advised to double up dosing of HRT to manage. New rx sent.

## 2024-05-30 ENCOUNTER — Other Ambulatory Visit: Payer: Self-pay | Admitting: Radiology

## 2024-05-30 DIAGNOSIS — Z1211 Encounter for screening for malignant neoplasm of colon: Secondary | ICD-10-CM

## 2024-09-03 ENCOUNTER — Other Ambulatory Visit: Payer: Self-pay | Admitting: Radiology

## 2024-09-03 DIAGNOSIS — N912 Amenorrhea, unspecified: Secondary | ICD-10-CM

## 2024-09-03 DIAGNOSIS — N951 Menopausal and female climacteric states: Secondary | ICD-10-CM

## 2024-09-03 DIAGNOSIS — R6882 Decreased libido: Secondary | ICD-10-CM

## 2024-09-05 ENCOUNTER — Other Ambulatory Visit: Payer: Self-pay | Admitting: Radiology

## 2024-09-05 ENCOUNTER — Other Ambulatory Visit: Payer: Self-pay

## 2024-09-05 DIAGNOSIS — N951 Menopausal and female climacteric states: Secondary | ICD-10-CM

## 2024-09-05 DIAGNOSIS — N912 Amenorrhea, unspecified: Secondary | ICD-10-CM

## 2024-09-05 DIAGNOSIS — R6882 Decreased libido: Secondary | ICD-10-CM

## 2024-09-05 DIAGNOSIS — E7800 Pure hypercholesterolemia, unspecified: Secondary | ICD-10-CM

## 2024-09-13 ENCOUNTER — Ambulatory Visit: Payer: Self-pay | Admitting: Radiology

## 2024-09-13 LAB — TESTOS,TOTAL,FREE AND SHBG (FEMALE)
Free Testosterone: 0.5 pg/mL (ref 0.1–6.4)
Sex Hormone Binding: 103 nmol/L (ref 17–124)
Testosterone, Total, LC-MS-MS: 7 ng/dL (ref 2–45)

## 2024-09-13 LAB — FOLLICLE STIMULATING HORMONE: FSH: 7.9 m[IU]/mL

## 2024-09-13 LAB — ESTRADIOL, ULTRA SENS: Estradiol, Ultra Sensitive: 147 pg/mL
